# Patient Record
Sex: Male | Born: 1950 | Race: Black or African American | Hispanic: No | Marital: Single | Smoking: Current every day smoker
Health system: Southern US, Community
[De-identification: ages and names within clinical notes are randomized; demographics above are authoritative.]

## PROBLEM LIST (undated history)

## (undated) DIAGNOSIS — I10 Essential (primary) hypertension: Secondary | ICD-10-CM

---

## 2004-09-19 ENCOUNTER — Emergency Department: Payer: Self-pay | Admitting: Emergency Medicine

## 2004-12-07 ENCOUNTER — Ambulatory Visit: Payer: Self-pay | Admitting: Internal Medicine

## 2007-05-25 ENCOUNTER — Emergency Department: Payer: Self-pay | Admitting: Emergency Medicine

## 2007-09-19 ENCOUNTER — Other Ambulatory Visit: Payer: Self-pay

## 2007-09-19 ENCOUNTER — Inpatient Hospital Stay: Payer: Self-pay | Admitting: General Surgery
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
## 2009-04-05 ENCOUNTER — Ambulatory Visit: Payer: Self-pay | Admitting: Family Medicine

## 2013-02-14 ENCOUNTER — Emergency Department: Payer: Self-pay | Admitting: Emergency Medicine

## 2018-01-31 ENCOUNTER — Emergency Department
Admission: EM | Admit: 2018-01-31 | Discharge: 2018-01-31 | Disposition: A | Discharge status: Home / Self Care | Payer: Commercial Managed Care - HMO | Attending: Emergency Medicine | Admitting: Emergency Medicine

## 2018-01-31 DIAGNOSIS — F17210 Nicotine dependence, cigarettes, uncomplicated: Secondary | ICD-10-CM | POA: Insufficient documentation

## 2018-01-31 DIAGNOSIS — F10920 Alcohol use, unspecified with intoxication, uncomplicated: Secondary | ICD-10-CM | POA: Insufficient documentation

## 2018-01-31 HISTORY — DX: Essential (primary) hypertension: I10

## 2018-01-31 NOTE — ED Provider Notes (Signed)
[facility] Emergency Department Provider Note ____________________________________________   First MD Initiated Contact with Patient 01/31/18 2204     (approximate)  I have reviewed the triage vital signs and the nursing notes.   HISTORY  Chief Complaint Medical Clearance  Level 5 caveat: History of present illness limited due to intoxication  HPI Brian Sanders is a 67 y.o. male with history of hypertension and hepatitis C who presents for medical clearance.  Patient [place] in the [place] of [employer] and is intoxicated.  The patient admits to drinking alcohol and using some cocaine.  He denies any acute medical complaints at this time.  The officer states that the patient has not had any trauma to his knowledge.  Past Medical History:  Diagnosis Date  . Hypertension     There are no active problems to display for this patient.   History reviewed. No pertinent surgical history.  Prior to Admission medications   Not on File    Allergies Patient has no known allergies.  No family history on file.  Social History Social History   Tobacco Use  . Smoking status: Current Every Day Smoker  . Smokeless tobacco: Never Used  Substance Use Topics  . Alcohol use: Yes  . Drug use: Not on file    Review of Systems Level 5 caveat: Unable to obtain review of systems due to intoxication    ____________________________________________   PHYSICAL EXAM:  VITAL SIGNS: ED Triage Vitals  Enc Vitals Group     BP 01/31/18 2122 135/75     Pulse Rate 01/31/18 2122 100     Resp 01/31/18 2122 18     Temp 01/31/18 2122 97.8 F (36.6 C)     Temp src --      SpO2 01/31/18 2122 95 %     Weight 01/31/18 2127 128 lb (58.1 kg)     Height 01/31/18 2127 5' 7.5" (1.715 m)     Head Circumference --      Peak Flow --      Pain Score 01/31/18 2127 0     Pain Loc --      Pain Edu? --      Excl. in GC? --     Constitutional: Alert and  oriented.  Comfortable appearing. Eyes: Conjunctivae are normal.  EOMI.  PERRLA. Head: Atraumatic. Nose: No congestion/rhinnorhea. Mouth/Throat: Mucous membranes are moist.   Neck: Normal range of motion.  Cardiovascular:   Good peripheral circulation. Respiratory: Normal respiratory effort.  Gastrointestinal: No distention.  Musculoskeletal: Extremities warm and well perfused.  Neurologic: Slightly slurred speech.  Motor intact in all extremities. Skin:  Skin is warm and dry. No rash noted. Psychiatric: Calm and cooperative.  ____________________________________________   LABS (all labs ordered are listed, but only abnormal results are displayed)  Labs Reviewed - No data to display ____________________________________________  EKG   ____________________________________________  RADIOLOGY    ____________________________________________   PROCEDURES  Procedure(s) performed: No  Procedures  Critical Care performed: No ____________________________________________   INITIAL IMPRESSION / ASSESSMENT AND PLAN / ED COURSE  Pertinent labs & imaging results that were available during my care of the patient were reviewed by me and considered in my medical decision making (see chart for details).  67 year old male with PMH as noted above presents for medical clearance and [employer] custody.  The patient admits to drinking alcohol and using cocaine, and appears intoxicated.  However there is no evidence of trauma, his neuro exam is  nonfocal, and the patient is calm and cooperative.  At this time there is no contraindication for the patient to go to jail or arrangement.  He is medically cleared.  Return precautions given.      ____________________________________________   FINAL CLINICAL IMPRESSION(S) / ED DIAGNOSES  Final diagnoses:  Alcoholic intoxication without complication (HCC)      NEW MEDICATIONS STARTED DURING THIS VISIT:  There are no discharge medications  for this patient.    Note:  This document was prepared using Dragon voice recognition software and may include unintentional dictation errors.    Dionne Bucy, MD 01/31/18 [phone]

## 2018-01-31 NOTE — ED Notes (Addendum)
Pt reported that he had cocaine and alcohol today. Officer is at the bedside. Pt is loud yet calm and cooperative.

## 2018-01-31 NOTE — ED Triage Notes (Signed)
Patient arrives in custody of [employer]. Patient is under arrest, needs medical clearance. Patient is intoxicated per officer.

## 2018-01-31 NOTE — ED Triage Notes (Signed)
Patient admits to drinking 2 40s. Patient has slurred speech. Patient has urinated on himself.

## 2018-01-31 NOTE — Discharge Instructions (Addendum)
Brian Sanders has been evaluated in the emergency department.  At this time, he is medically cleared for jail and/or arrangement.  He may return to the ER for any acute medical concerns.

## 2018-02-27 ENCOUNTER — Encounter: Payer: Self-pay | Admitting: *Deleted

## 2019-02-08 ENCOUNTER — Emergency Department
Admission: EM | Admit: 2019-02-08 | Discharge: 2019-02-09 | Disposition: A | Discharge status: Home / Self Care | Payer: Medicare HMO | Attending: Emergency Medicine | Admitting: Emergency Medicine

## 2019-02-08 ENCOUNTER — Other Ambulatory Visit: Payer: Self-pay

## 2019-02-08 DIAGNOSIS — R7989 Other specified abnormal findings of blood chemistry: Secondary | ICD-10-CM

## 2019-02-08 DIAGNOSIS — E871 Hypo-osmolality and hyponatremia: Secondary | ICD-10-CM | POA: Diagnosis not present

## 2019-02-08 DIAGNOSIS — I10 Essential (primary) hypertension: Secondary | ICD-10-CM | POA: Insufficient documentation

## 2019-02-08 DIAGNOSIS — Y908 Blood alcohol level of 240 mg/100 ml or more: Secondary | ICD-10-CM | POA: Insufficient documentation

## 2019-02-08 DIAGNOSIS — F10929 Alcohol use, unspecified with intoxication, unspecified: Secondary | ICD-10-CM

## 2019-02-08 DIAGNOSIS — Z046 Encounter for general psychiatric examination, requested by authority: Secondary | ICD-10-CM | POA: Insufficient documentation

## 2019-02-08 DIAGNOSIS — F17200 Nicotine dependence, unspecified, uncomplicated: Secondary | ICD-10-CM | POA: Diagnosis not present

## 2019-02-08 DIAGNOSIS — R4182 Altered mental status, unspecified: Secondary | ICD-10-CM | POA: Diagnosis present

## 2019-02-08 DIAGNOSIS — F1410 Cocaine abuse, uncomplicated: Secondary | ICD-10-CM | POA: Diagnosis not present

## 2019-02-08 LAB — COMPREHENSIVE METABOLIC PANEL
ALT: 43 U/L (ref 0–44)
AST: 52 U/L — ABNORMAL HIGH (ref 15–41)
Albumin: 3.9 g/dL (ref 3.5–5.0)
Alkaline Phosphatase: 46 U/L (ref 38–126)
Anion gap: 12 (ref 5–15)
BUN: 22 mg/dL (ref 8–23)
CO2: 21 mmol/L — ABNORMAL LOW (ref 22–32)
Calcium: 9 mg/dL (ref 8.9–10.3)
Chloride: 93 mmol/L — ABNORMAL LOW (ref 98–111)
Creatinine, Ser: 1.87 mg/dL — ABNORMAL HIGH (ref 0.61–1.24)
GFR calc Af Amer: 42 mL/min — ABNORMAL LOW (ref >60)
GFR calc non Af Amer: 36 mL/min — ABNORMAL LOW (ref >60)
Glucose, Bld: 110 mg/dL — ABNORMAL HIGH (ref 70–99)
Potassium: 4.6 mmol/L (ref 3.5–5.1)
Sodium: 126 mmol/L — ABNORMAL LOW (ref 135–145)
Total Bilirubin: 0.6 mg/dL (ref 0.3–1.2)
Total Protein: 7.6 g/dL (ref 6.5–8.1)

## 2019-02-08 LAB — URINE DRUG SCREEN, QUALITATIVE (ARMC ONLY)
Amphetamines, Ur Screen: NOT DETECTED
Barbiturates, Ur Screen: NOT DETECTED
Benzodiazepine, Ur Scrn: NOT DETECTED
Cannabinoid 50 Ng, Ur ~~LOC~~: NOT DETECTED
Cocaine Metabolite,Ur ~~LOC~~: POSITIVE — AB
MDMA (Ecstasy)Ur Screen: NOT DETECTED
Methadone Scn, Ur: NOT DETECTED
Opiate, Ur Screen: NOT DETECTED
Phencyclidine (PCP) Ur S: NOT DETECTED
Tricyclic, Ur Screen: NOT DETECTED

## 2019-02-08 LAB — CBC
HCT: 38.5 % — ABNORMAL LOW (ref 39.0–52.0)
Hemoglobin: 12.9 g/dL — ABNORMAL LOW (ref 13.0–17.0)
MCH: 28.9 pg (ref 26.0–34.0)
MCHC: 33.5 g/dL (ref 30.0–36.0)
MCV: 86.3 fL (ref 80.0–100.0)
Platelets: 241 10*3/uL (ref 150–400)
RBC: 4.46 MIL/uL (ref 4.22–5.81)
RDW: 13.1 % (ref 11.5–15.5)
WBC: 7.3 10*3/uL (ref 4.0–10.5)
nRBC: 0 % (ref 0.0–0.2)

## 2019-02-08 LAB — ACETAMINOPHEN LEVEL: Acetaminophen (Tylenol), Serum: <10 ug/mL — ABNORMAL LOW (ref 10–30)

## 2019-02-08 LAB — SALICYLATE LEVEL: Salicylate Lvl: <7 mg/dL (ref 2.8–30.0)

## 2019-02-08 LAB — ETHANOL: Alcohol, Ethyl (B): 295 mg/dL — ABNORMAL HIGH (ref <10)

## 2019-02-08 MED ORDER — SODIUM CHLORIDE 0.9 % IV BOLUS
1000.0000 mL | Freq: Once | INTRAVENOUS | Status: AC
  Administered 2019-02-08: 1000 mL via INTRAVENOUS

## 2019-02-08 MED ORDER — THIAMINE HCL 100 MG/ML IJ SOLN
100.0000 mg | Freq: Once | INTRAMUSCULAR | Status: AC
  Administered 2019-02-08: 100 mg via INTRAVENOUS
  Filled 2019-02-08: qty 2
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
## 2019-02-08 NOTE — ED Notes (Signed)
Pt provided meal tray and coke to drink
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
## 2019-02-09 DIAGNOSIS — F10929 Alcohol use, unspecified with intoxication, unspecified: Secondary | ICD-10-CM | POA: Insufficient documentation

## 2019-02-09 LAB — BASIC METABOLIC PANEL
Anion gap: 8 (ref 5–15)
BUN: 19 mg/dL (ref 8–23)
CO2: 23 mmol/L (ref 22–32)
Calcium: 8.8 mg/dL — ABNORMAL LOW (ref 8.9–10.3)
Chloride: 103 mmol/L (ref 98–111)
Creatinine, Ser: 1.28 mg/dL — ABNORMAL HIGH (ref 0.61–1.24)
GFR calc Af Amer: >60 mL/min (ref >60)
GFR calc non Af Amer: 57 mL/min — ABNORMAL LOW (ref >60)
Glucose, Bld: 110 mg/dL — ABNORMAL HIGH (ref 70–99)
Potassium: 4.3 mmol/L (ref 3.5–5.1)
Sodium: 134 mmol/L — ABNORMAL LOW (ref 135–145)

## 2019-02-09 NOTE — ED Notes (Signed)
Pt given lunch tray.

## 2019-02-09 NOTE — ED Notes (Signed)
Pt given cab voucher by charge nurse. Unable to contact his sister to provide  a ride home.

## 2019-02-09 NOTE — Consult Note (Signed)
[facility] Face-to-Face Psychiatry Consult   Reason for Consult: psych evaluation due to bizarre behavior and intoxication Referring Physician:  Dr Don Perking Patient Identification: Brian Sanders MRN:  [id number] Principal Diagnosis: <principal problem not specified> Diagnosis:  Alcohol use disorder. Cocaine use disorder.  Total Time spent with patient: 30 minutes  Subjective:   Brian Sanders is a 68 y.o. male patient who was brought to ED last night for bizarre behavior.   HPI:  Reportedly, patient was found laying in the road and was almost stuck by a car.  On arrival to ED, patient was loud, used inappropriate language, screaming "I am high". His BAL was 295 and U-tox positive for cocaine. Other labs remarkable for low Na at 126, elevated creatinin at 1.87, GFR 36. ALT 52, AST 43.  Patient evaluated in AM, while clinically sober.   Patient reports "feeling allright". He states he remembers "somewhat" events from last night. He reports he was drinking and using cocaine. He reports he was walking and fell down on the street. He denies any intentions to kill self. He says "I was drunk". He denies feeling depressed. He denies any thoughts, plans of harming self or others. He denies any hallucinations, does not express delusions. He denies any current physical complaints. He reports he is homeless and was staying on the street for last two weeks after "my sister kicked me out" due to substance use. He denies any past mental health diagnoses; denies taking any psychotropic medications. He reports taking medications for "high blood pressure". He reports feeling safe for discharge. He was offered outpatient chemical-dependency resources which he declined.  PSYCH ROS: Safety: denies suicidal thoughts, denies homicidal thoughts. Depression: denies symptoms.    Anxiety: denies symptoms.  Psychosis: No symptoms;  Mania: no symptoms;  Dementia: no symptoms.  Delirium: no symptoms (such as waxing  and waning confusion in a day; altered sensorium; impaired attention; speech problem).   PAST PSYCH HISTORY:  Previous psych diagnoses: denies Previous psychiatric hospitalizations: denies Previous outpatient psychiatrist: denies Therapist/Counselor: denies History of prior suicide attempts: denies Non-suicidal self-injurious behaviors: denies History of violence: denies Previous psych medication: denies Current psych medications: denies   SOCIAL HISTORY: -Patient has no guardian. -Homeless -single -Children: no -Work/finances: unemployed -Armed forces operational officer history: denies -Guns in possession: denies    SUBSTANCE USE: alcohol, cocaine use - occasionally.  PSYCH RISK ASSESSMENT: Suicide Risk Assessment: male gender, age, alcohol/substance use, homelessness - risk factors. The patient denies suicidal thoughts, denies a history of suicidal attempts, denies access to firearm and appears to be is future-oriented. Therefore, represents a low risk for harming self acutely and elevated chronic risk due to non-modifiable risk factors.  Violence Risk Assessment: male gender, substance use - are static risk factors predisposing to violence - place him at low to moderate chronic risk of harming others; though he is an acute low risk to others as he is not having any thoughts of wanting to hurt others and recognizes the consequences if he were be physically violent.  Risk to Self:  No Risk to Others:   Prior Inpatient Therapy:  no Prior Outpatient Therapy:  no  Past Medical History:  Past Medical History:  Diagnosis Date  . Hypertension    No past surgical history on file. Family History: No family history on file. Family Psychiatric  History: denies h/o suicides Social History: see above Social History   Substance and Sexual Activity  Alcohol Use Yes     Social History   Substance and  Sexual Activity  Drug Use Not on file    Social History   Socioeconomic History  . Marital status:  Single    Spouse name: Not on file  . Number of children: Not on file  . Years of education: Not on file  . Highest education level: Not on file  Occupational History  . Not on file  Social Needs  . Financial resource strain: Not on file  . Food insecurity:    Worry: Not on file    Inability: Not on file  . Transportation needs:    Medical: Not on file    Non-medical: Not on file  Tobacco Use  . Smoking status: Current Every Day Smoker  . Smokeless tobacco: Never Used  Substance and Sexual Activity  . Alcohol use: Yes  . Drug use: Not on file  . Sexual activity: Not on file  Lifestyle  . Physical activity:    Days per week: Not on file    Minutes per session: Not on file  . Stress: Not on file  Relationships  . Social connections:    Talks on phone: Not on file    Gets together: Not on file    Attends religious service: Not on file    Active member of club or organization: Not on file    Attends meetings of clubs or organizations: Not on file    Relationship status: Not on file  Other Topics Concern  . Not on file  Social History Narrative  . Not on file   Additional Social History: see above    Allergies:  No Known Allergies  Labs:  Results for orders placed or performed during the hospital encounter of 02/08/19 (from the past 48 hour(s))  Comprehensive metabolic panel     Status: Abnormal   Collection Time: 02/08/19  9:40 PM  Result Value Ref Range   Sodium 126 (L) 135 - 145 mmol/L   Potassium 4.6 3.5 - 5.1 mmol/L   Chloride 93 (L) 98 - 111 mmol/L   CO2 21 (L) 22 - 32 mmol/L   Glucose, Bld 110 (H) 70 - 99 mg/dL   BUN 22 8 - 23 mg/dL   Creatinine, Ser 0.17 (H) 0.61 - 1.24 mg/dL   Calcium 9.0 8.9 - 49.4 mg/dL   Total Protein 7.6 6.5 - 8.1 g/dL   Albumin 3.9 3.5 - 5.0 g/dL   AST 52 (H) 15 - 41 U/L   ALT 43 0 - 44 U/L   Alkaline Phosphatase 46 38 - 126 U/L   Total Bilirubin 0.6 0.3 - 1.2 mg/dL   GFR calc non Af Amer 36 (L) >60 mL/min   GFR calc Af Amer  42 (L) >60 mL/min   Anion gap 12 5 - 15    Comment: Performed at [facility], [address] [place], [place] [address]  Ethanol     Status: Abnormal   Collection Time: 02/08/19  9:40 PM  Result Value Ref Range   Alcohol, Ethyl (B) 295 (H) <10 mg/dL    Comment: (NOTE) Lowest detectable limit for serum alcohol is 10 mg/dL. For medical purposes only. Performed at [facility], [address] [place], [place] [address]   Salicylate level     Status: None   Collection Time: 02/08/19  9:40 PM  Result Value Ref Range   Salicylate Lvl <7.0 2.8 - 30.0 mg/dL    Comment: Performed at [facility], [address] [place], [place] [address]  Acetaminophen  level     Status: Abnormal   Collection Time: 02/08/19  9:40 PM  Result Value Ref Range   Acetaminophen (Tylenol), Serum <10 (L) 10 - 30 ug/mL    Comment: (NOTE) Therapeutic concentrations vary significantly. A range of 10-30 ug/mL  may be an effective concentration for many patients. However, some  are best treated at concentrations outside of this range. Acetaminophen concentrations >150 ug/mL at 4 hours after ingestion  and >50 ug/mL at 12 hours after ingestion are often associated with  toxic reactions. Performed at [facility], [address] [place], [place] [address]   cbc     Status: Abnormal   Collection Time: 02/08/19  9:40 PM  Result Value Ref Range   WBC 7.3 4.0 - 10.5 K/uL   RBC 4.46 4.22 - 5.81 MIL/uL   Hemoglobin 12.9 (L) 13.0 - 17.0 g/dL   HCT 60.4 (L) 54.0 - 98.1 %   MCV 86.3 80.0 - 100.0 fL   MCH 28.9 26.0 - 34.0 pg   MCHC 33.5 30.0 - 36.0 g/dL   RDW 19.1 47.8 - 29.5 %   Platelets 241 150 - 400 K/uL   nRBC 0.0 0.0 - 0.2 %    Comment: Performed at [facility], [address], [place], [place] [address]  Urine Drug Screen, Qualitative     Status: Abnormal   Collection Time: 02/08/19  9:40 PM  Result Value Ref Range   Tricyclic, Ur Screen NONE DETECTED NONE  DETECTED   Amphetamines, Ur Screen NONE DETECTED NONE DETECTED   MDMA (Ecstasy)Ur Screen NONE DETECTED NONE DETECTED   Cocaine Metabolite,Ur [place] POSITIVE (A) NONE DETECTED   Opiate, Ur Screen NONE DETECTED NONE DETECTED   Phencyclidine (PCP) Ur S NONE DETECTED NONE DETECTED   Cannabinoid 50 Ng, Ur [place] NONE DETECTED NONE DETECTED   Barbiturates, Ur Screen NONE DETECTED NONE DETECTED   Benzodiazepine, Ur Scrn NONE DETECTED NONE DETECTED   Methadone Scn, Ur NONE DETECTED NONE DETECTED    Comment: (NOTE) Tricyclics + metabolites, urine    Cutoff 1000 ng/mL Amphetamines + metabolites, urine  Cutoff 1000 ng/mL MDMA (Ecstasy), urine              Cutoff 500 ng/mL Cocaine Metabolite, urine          Cutoff 300 ng/mL Opiate + metabolites, urine        Cutoff 300 ng/mL Phencyclidine (PCP), urine         Cutoff 25 ng/mL Cannabinoid, urine                 Cutoff 50 ng/mL Barbiturates + metabolites, urine  Cutoff 200 ng/mL Benzodiazepine, urine              Cutoff 200 ng/mL Methadone, urine                   Cutoff 300 ng/mL The urine drug screen provides only a preliminary, unconfirmed analytical test result and should not be used for non-medical purposes. Clinical consideration and professional judgment should be applied to any positive drug screen result due to possible interfering substances. A more specific alternate chemical method must be used in order to obtain a confirmed analytical result. Gas chromatography / mass spectrometry (GC/MS) is the preferred confirmat ory method. Performed at [facility], [address], [place], [place] [address]   Basic metabolic panel     Status: Abnormal   Collection Time: 02/09/19  7:56 AM  Result Value Ref Range  Sodium 134 (L) 135 - 145 mmol/L   Potassium 4.3 3.5 - 5.1 mmol/L   Chloride 103 98 - 111 mmol/L   CO2 23 22 - 32 mmol/L   Glucose, Bld 110 (H) 70 - 99 mg/dL   BUN 19 8 - 23 mg/dL   Creatinine, Ser 1.611.28 (H) 0.61 - 1.24 mg/dL    Calcium 8.8 (L) 8.9 - 10.3 mg/dL   GFR calc non Af Amer 57 (L) >60 mL/min   GFR calc Af Amer >60 >60 mL/min   Anion gap 8 5 - 15    Comment: Performed at [facility], [address] [place], [place] [address]    No current facility-administered medications for this encounter.    No current outpatient medications on file.    Psychiatric Specialty Exam: Physical Exam  ROS  Blood pressure 131/81, pulse 79, temperature 97.9 F (36.6 C), temperature source Oral, resp. rate 18, height 5\' 7"  (1.702 m), weight 68 kg, SpO2 98 %.Body mass index is 23.49 kg/m.  General Appearance: Disheveled  Eye Contact:  Fair  Speech:  Normal Rate  Volume:  Normal  Mood:  Euthymic  Affect:  Constricted  Thought Process:  Coherent and Linear  Orientation:  Place and person  Thought Content:  Logical   Thought perception: patient denies auditory and visual hallucinations, no illusions, no depersonalizations. Did not appear internally stimulated.    Suicidal Thoughts:  No  Homicidal Thoughts:  No  Memory:  Immediate;   Fair Recent;   Fair Remote;   Fair  Judgement:  Fair  Insight:  Fair  Psychomotor Activity:  Normal  Concentration:  Concentration: Fair and Attention Span: Fair  Recall:    Fund of Knowledge:  Fair  Language:  Fair  Akathisia:  No  Handed:    AIMS (if indicated):     Assets:  Desire for Improvement  ADL's:  Intact  Cognition:  WNL  Sleep:       Disposition: No evidence of imminent risk to self or others at present.   Patient does not meet criteria for psychiatric inpatient admission.  ASSESSMENT: Brian Sanders is a 68 y.o. male with a history of alcohol use disorder, cocaine use disorder, who was brought to ER by EMS due to bizarre behavior. Psychiatry called for consult.  On arrival patient appeared intoxicated, BAL was 295 and U-tox positive for cocaine. Patient slept overnight, evaluated in AM when sober. During my assessment, pt is calm, cooperative,  pleasant and logical, has no intentions of harming self or anybody, he does not appear to be acutely psychotic or at risk of harm to self or others. His substance use seems to be his chronic condition. Patient does not meet criteria for an inpatient psych admission.  Impression: Alcohol use disorder. Cocaine use disorder. Acute alcohol intoxication.  Recommendations: -No indication for inpatient psychiatric hospitalization -continue Thiamine, folic acid, multivitamins. -Patient declined outpatient chemical-dependency resources at this time. -Should the patient be unable to maintain his personal safety or the safety of others, instructions were provided to dial 9-1-1 or go to the closest emergency room.   Thalia PartyAlisa Jasson Siegmann, MD 02/09/2019 10:07 AM

## 2019-02-09 NOTE — ED Notes (Signed)
Pt discharged home with cab voucher. VS stable. Denies pain. Denies SI/HI. Discharge instructions reviewed with patient. Patient signed for discharge. All belongings returned to patient. Confirmed by patient.

## 2019-02-09 NOTE — Consult Note (Deleted)
[facility] Face-to-Face Psychiatry Consult   Reason for Consult:  Psych eval due to self-injury Referring Physician:  Dr. Don Perking Patient Identification: Brian Sanders MRN:  [id number] Principal Diagnosis: <principal problem not specified>   Diagnosis: Alcohol use disorder.  Total Time spent with patient: 45 minutes  Subjective:   Brian Sanders is a 68 y.o. AAM with a history of substance use.     Past Medical History:  Past Medical History:  Diagnosis Date  . Hypertension    No past surgical history on file. Family History: No family history on file. Family Psychiatric  History: see above Social History: see above Social History   Substance and Sexual Activity  Alcohol Use Yes     Social History   Substance and Sexual Activity  Drug Use Not on file    Social History   Socioeconomic History  . Marital status: Single    Spouse name: Not on file  . Number of children: Not on file  . Years of education: Not on file  . Highest education level: Not on file  Occupational History  . Not on file  Social Needs  . Financial resource strain: Not on file  . Food insecurity:    Worry: Not on file    Inability: Not on file  . Transportation needs:    Medical: Not on file    Non-medical: Not on file  Tobacco Use  . Smoking status: Current Every Day Smoker  . Smokeless tobacco: Never Used  Substance and Sexual Activity  . Alcohol use: Yes  . Drug use: Not on file  . Sexual activity: Not on file  Lifestyle  . Physical activity:    Days per week: Not on file    Minutes per session: Not on file  . Stress: Not on file  Relationships  . Social connections:    Talks on phone: Not on file    Gets together: Not on file    Attends religious service: Not on file    Active member of club or organization: Not on file    Attends meetings of clubs or organizations: Not on file    Relationship status: Not on file  Other Topics Concern  . Not on file  Social History  Narrative  . Not on file   Additional Social History:    Allergies:  No Known Allergies  Labs:  Results for orders placed or performed during the hospital encounter of 02/08/19 (from the past 48 hour(s))  Comprehensive metabolic panel     Status: Abnormal   Collection Time: 02/08/19  9:40 PM  Result Value Ref Range   Sodium 126 (L) 135 - 145 mmol/L   Potassium 4.6 3.5 - 5.1 mmol/L   Chloride 93 (L) 98 - 111 mmol/L   CO2 21 (L) 22 - 32 mmol/L   Glucose, Bld 110 (H) 70 - 99 mg/dL   BUN 22 8 - 23 mg/dL   Creatinine, Ser 1.02 (H) 0.61 - 1.24 mg/dL   Calcium 9.0 8.9 - 72.5 mg/dL   Total Protein 7.6 6.5 - 8.1 g/dL   Albumin 3.9 3.5 - 5.0 g/dL   AST 52 (H) 15 - 41 U/L   ALT 43 0 - 44 U/L   Alkaline Phosphatase 46 38 - 126 U/L   Total Bilirubin 0.6 0.3 - 1.2 mg/dL   GFR calc non Af Amer 36 (L) >60 mL/min   GFR calc Af Amer 42 (L) >60 mL/min   Anion gap 12 5 -  15    Comment: Performed at [facility], [address] [place], [place] [address]  Ethanol     Status: Abnormal   Collection Time: 02/08/19  9:40 PM  Result Value Ref Range   Alcohol, Ethyl (B) 295 (H) <10 mg/dL    Comment: (NOTE) Lowest detectable limit for serum alcohol is 10 mg/dL. For medical purposes only. Performed at [facility], [address] [place], [place] [address]   Salicylate level     Status: None   Collection Time: 02/08/19  9:40 PM  Result Value Ref Range   Salicylate Lvl <7.0 2.8 - 30.0 mg/dL    Comment: Performed at [facility], [address] [place], [place] [address]  Acetaminophen level     Status: Abnormal   Collection Time: 02/08/19  9:40 PM  Result Value Ref Range   Acetaminophen (Tylenol), Serum <10 (L) 10 - 30 ug/mL    Comment: (NOTE) Therapeutic concentrations vary significantly. A range of 10-30 ug/mL  may be an effective concentration for many patients. However, some  are best treated at concentrations outside of this range. Acetaminophen  concentrations >150 ug/mL at 4 hours after ingestion  and >50 ug/mL at 12 hours after ingestion are often associated with  toxic reactions. Performed at [facility], [address] [place], [place] [address]   cbc     Status: Abnormal   Collection Time: 02/08/19  9:40 PM  Result Value Ref Range   WBC 7.3 4.0 - 10.5 K/uL   RBC 4.46 4.22 - 5.81 MIL/uL   Hemoglobin 12.9 (L) 13.0 - 17.0 g/dL   HCT 65.738.5 (L) 84.639.0 - 96.252.0 %   MCV 86.3 80.0 - 100.0 fL   MCH 28.9 26.0 - 34.0 pg   MCHC 33.5 30.0 - 36.0 g/dL   RDW 95.213.1 84.111.5 - 32.415.5 %   Platelets 241 150 - 400 K/uL   nRBC 0.0 0.0 - 0.2 %    Comment: Performed at [facility], [address] [place], [place] [address]  Urine Drug Screen, Qualitative     Status: Abnormal   Collection Time: 02/08/19  9:40 PM  Result Value Ref Range   Tricyclic, Ur Screen NONE DETECTED NONE DETECTED   Amphetamines, Ur Screen NONE DETECTED NONE DETECTED   MDMA (Ecstasy)Ur Screen NONE DETECTED NONE DETECTED   Cocaine Metabolite,Ur [place] POSITIVE (A) NONE DETECTED   Opiate, Ur Screen NONE DETECTED NONE DETECTED   Phencyclidine (PCP) Ur S NONE DETECTED NONE DETECTED   Cannabinoid 50 Ng, Ur [place] NONE DETECTED NONE DETECTED   Barbiturates, Ur Screen NONE DETECTED NONE DETECTED   Benzodiazepine, Ur Scrn NONE DETECTED NONE DETECTED   Methadone Scn, Ur NONE DETECTED NONE DETECTED    Comment: (NOTE) Tricyclics + metabolites, urine    Cutoff 1000 ng/mL Amphetamines + metabolites, urine  Cutoff 1000 ng/mL MDMA (Ecstasy), urine              Cutoff 500 ng/mL Cocaine Metabolite, urine          Cutoff 300 ng/mL Opiate + metabolites, urine        Cutoff 300 ng/mL Phencyclidine (PCP), urine         Cutoff 25 ng/mL Cannabinoid, urine                 Cutoff 50 ng/mL Barbiturates + metabolites, urine  Cutoff 200 ng/mL Benzodiazepine, urine              Cutoff 200 ng/mL  Methadone, urine                   Cutoff 300 ng/mL The urine drug screen provides only a  preliminary, unconfirmed analytical test result and should not be used for non-medical purposes. Clinical consideration and professional judgment should be applied to any positive drug screen result due to possible interfering substances. A more specific alternate chemical method must be used in order to obtain a confirmed analytical result. Gas chromatography / mass spectrometry (GC/MS) is the preferred confirmat ory method. Performed at [facility], [address] [place], [place] [address]     No current facility-administered medications for this encounter.    No current outpatient medications on file.    Psychiatric Specialty Exam: Physical Exam  ROS  Blood pressure 111/77, pulse (!) 109, temperature 98.5 F (36.9 C), temperature source Oral, resp. rate 18, height 5\' 7"  (1.702 m), weight 68 kg, SpO2 100 %.Body mass index is 23.49 kg/m.  General Appearance: discheveled  Eye Contact:  Good  Speech:  Normal Rate  Volume:  Normal  Mood:  Euthymic  Affect:  Appropriate and Congruent  Thought Process:  Coherent, Goal Directed and Linear  Orientation:  Place and person  Thought Content:  Logical  Suicidal Thoughts:  No  Homicidal Thoughts:  No  Memory:  Immediate;   Fair Recent;   Fair Remote;   Fair  Judgement:  Good  Insight:  Fair  Psychomotor Activity:  Normal  Concentration:  Concentration: Fair and Attention Span: Fair  Recall:  [employer] of Knowledge:  Fair  Language:  Good  Akathisia:  No  Handed:    AIMS (if indicated):     Assets:  Desire for Improvement Housing  ADL's:  Intact  Cognition:  WNL  Sleep:        Disposition: No evidence of imminent risk to self or others at present.   Patient does not meet criteria for psychiatric inpatient admission.  ASSESSMENT: Brian Sanders is a 68 y.o. male with a history of alcohol use disorder, cocaine use disorder, who was brought to ER by EMS due to bizarre behavior. Psychiatry called for  consult.  On arrival patient appeared intoxicated, BAL was 295 and U-tox positive for cocaine. Patient slept overnight, evaluated in AM when sober. During my assessment, pt is calm, cooperative, pleasant and logical, has no intentions of harming self or anybody, he does not appear to be acutely psychotic or at risk of harm to self or others. His substance use seems to be his chronic condition. Patient does not meet criteria for an inpatient psych admission.  Impression: Alcohol use disorder. Cocaine use disorder. Acute alcohol intoxication.  Recommendations: -No indication for inpatient psychiatric hospitalization -continue Thiamine, folic acid, multivitamins. -SW to provide to patient a list of outpatient resources.   Thalia Party, MD 02/09/2019 9:15 AM

## 2019-02-09 NOTE — ED Notes (Signed)
Pt given breakfast tray

## 2019-02-09 NOTE — ED Provider Notes (Signed)
-----------------------------------------   6:28 AM on 02/09/2019 -----------------------------------------   Blood pressure 111/77, pulse (!) 109, temperature 98.5 F (36.9 C), temperature source Oral, resp. rate 18, height 5\' 7"  (1.702 m), weight 68 kg, SpO2 100 %.  The patient is sleeping at this time.  There have been no acute events since the last update.  Awaiting disposition plan from Behavioral Medicine team.   Irean Hong, MD 02/09/19 [phone]

## 2019-02-09 NOTE — ED Notes (Signed)
Pt dressing for discharge. Belongings searched.

## 2019-02-09 NOTE — ED Provider Notes (Signed)
-----------------------------------------   9:51 AM on 02/09/2019 -----------------------------------------  Sodium is 134 and repeat creatinine is improving we do not know his baseline but this is certainly better than when he came in.  Is no longer intoxicated he is eating and drinking he is requesting discharge he was seen evaluated by psychiatry contract for safety has no SI or HI he is disinclined to pursue alcohol counseling at this time he does not want I think the most he would like to be discharged.  Psychiatry did clear him for discharge, and however versus involuntary commitment.  We will refer him as an outpatient to primary care for his renal insufficiency as well [facility] for his EtOH abuse.  Return precautions follow-up given and understood.   Jeanmarie Plant, MD 02/09/19 [phone]

## 2020-08-03 ENCOUNTER — Other Ambulatory Visit: Payer: Self-pay | Admitting: Physician Assistant

## 2020-08-03 DIAGNOSIS — M79604 Pain in right leg: Secondary | ICD-10-CM

## 2020-08-03 DIAGNOSIS — M79605 Pain in left leg: Secondary | ICD-10-CM

## 2020-08-04 ENCOUNTER — Other Ambulatory Visit: Payer: Self-pay | Admitting: Physician Assistant

## 2020-08-04 DIAGNOSIS — I739 Peripheral vascular disease, unspecified: Secondary | ICD-10-CM

## 2020-08-05 ENCOUNTER — Other Ambulatory Visit: Payer: Self-pay

## 2020-08-05 ENCOUNTER — Ambulatory Visit
Admission: RE | Admit: 2020-08-05 | Discharge: 2020-08-05 | Disposition: A | Discharge status: Home / Self Care | Payer: Medicare Other | Source: Ambulatory Visit | Attending: Physician Assistant | Admitting: Physician Assistant

## 2020-08-05 DIAGNOSIS — M79604 Pain in right leg: Secondary | ICD-10-CM | POA: Diagnosis not present

## 2020-08-05 DIAGNOSIS — M79605 Pain in left leg: Secondary | ICD-10-CM | POA: Diagnosis present

## 2020-08-06 ENCOUNTER — Ambulatory Visit
Admission: RE | Admit: 2020-08-06 | Discharge: 2020-08-06 | Disposition: A | Discharge status: Home / Self Care | Payer: Medicare Other | Source: Ambulatory Visit | Attending: Physician Assistant | Admitting: Physician Assistant

## 2020-08-06 DIAGNOSIS — I739 Peripheral vascular disease, unspecified: Secondary | ICD-10-CM | POA: Insufficient documentation

## 2020-11-09 DEATH — deceased

## 2021-11-18 IMAGING — US US EXTREM LOW VENOUS
1 series · 13 of 24 positions shown · non-contrast
Comparison: None.

CLINICAL DATA: 6-year-old male with history of bilateral leg pain
for 2-3 weeks.



[Series 1: us venous img lower bilat (dvt) · portal-venous · 13 of 61 slices shown]
[im 1/61]
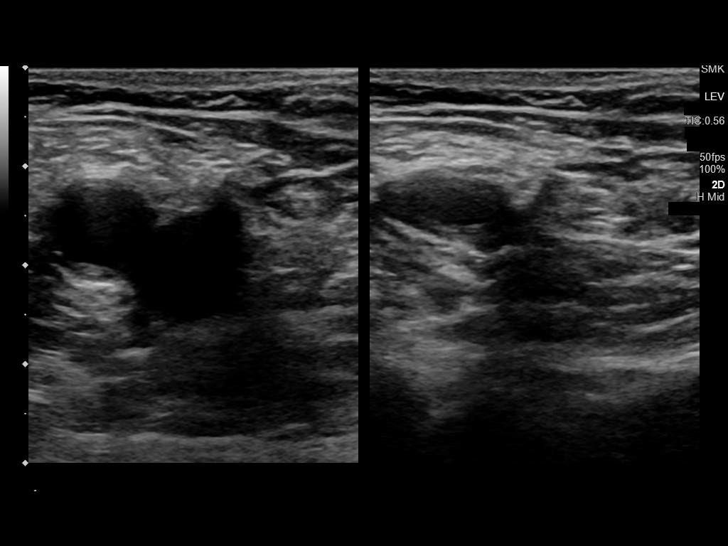
[im 6/61]
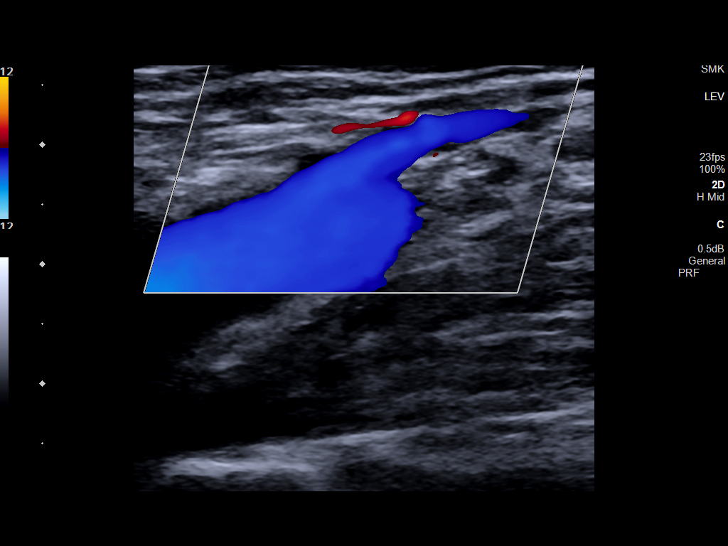
[im 11/61]
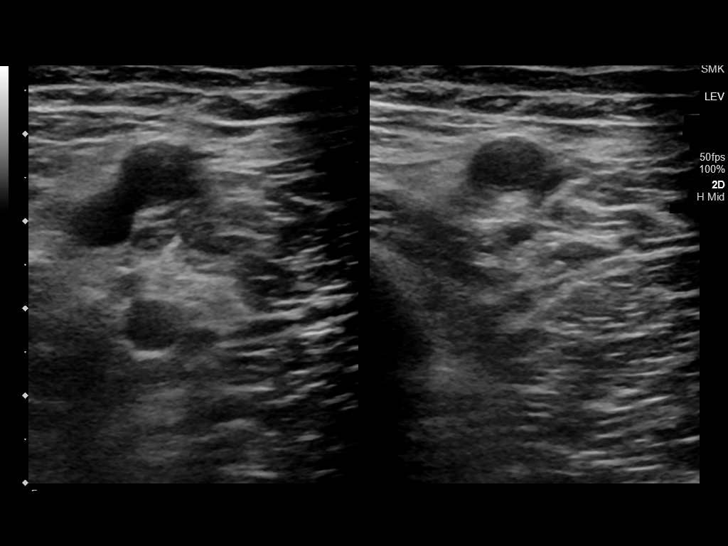
[im 16/61]
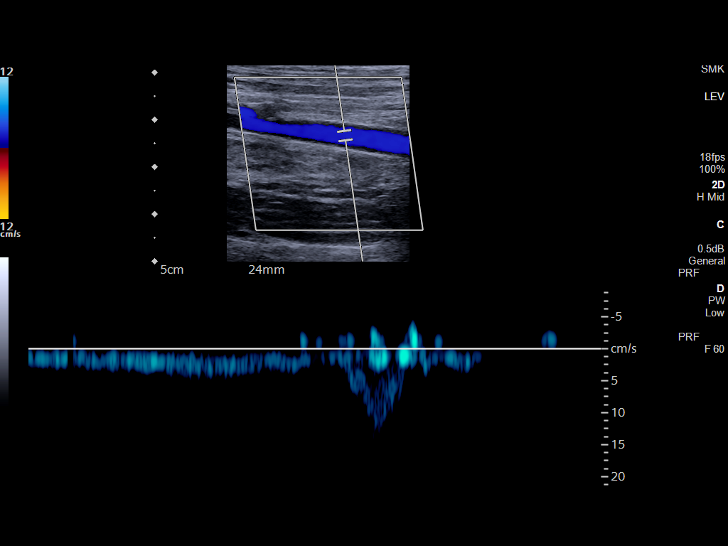
[im 21/61]
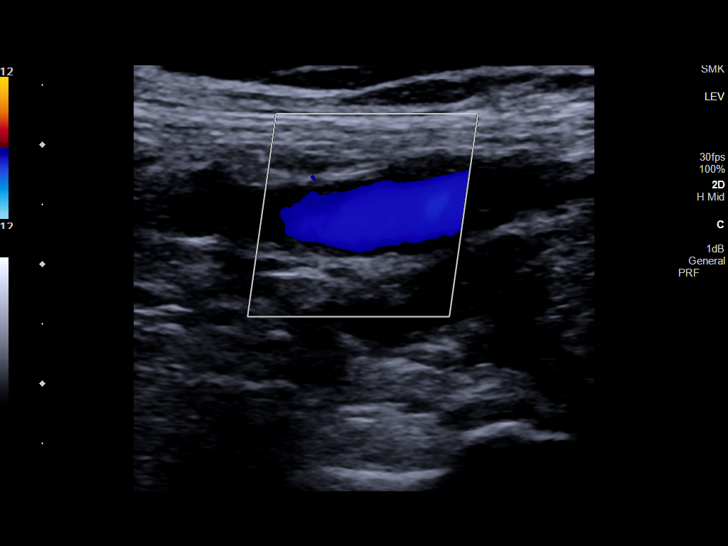
[im 27/61]
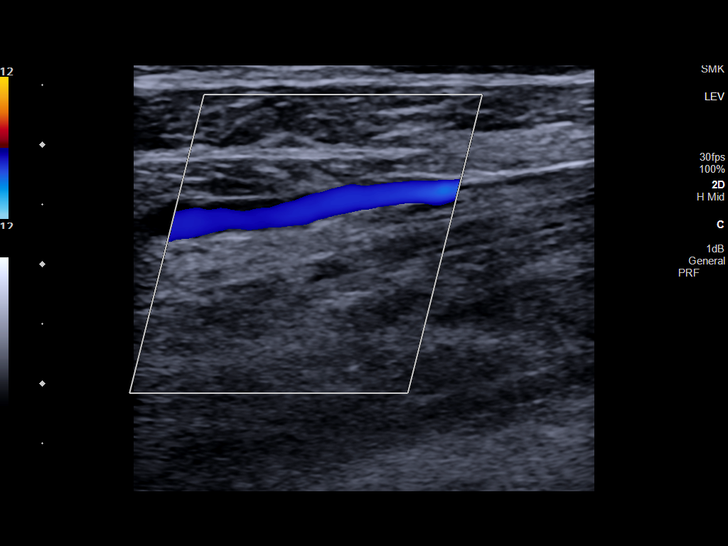
[im 32/61]
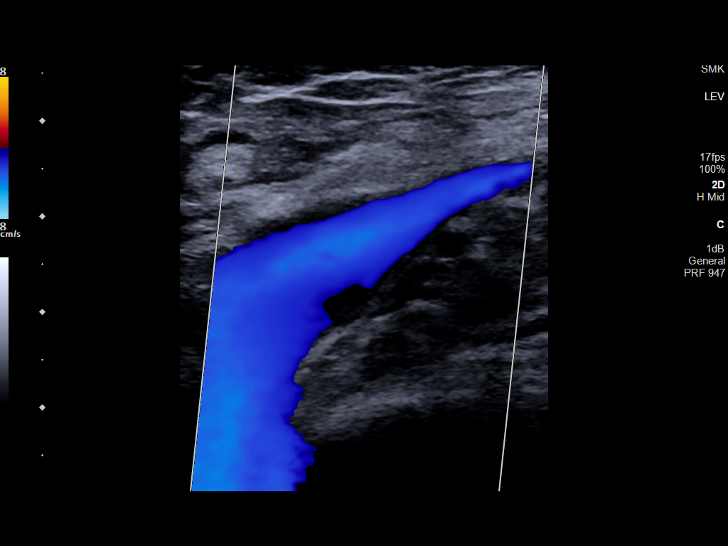
[im 34/61]
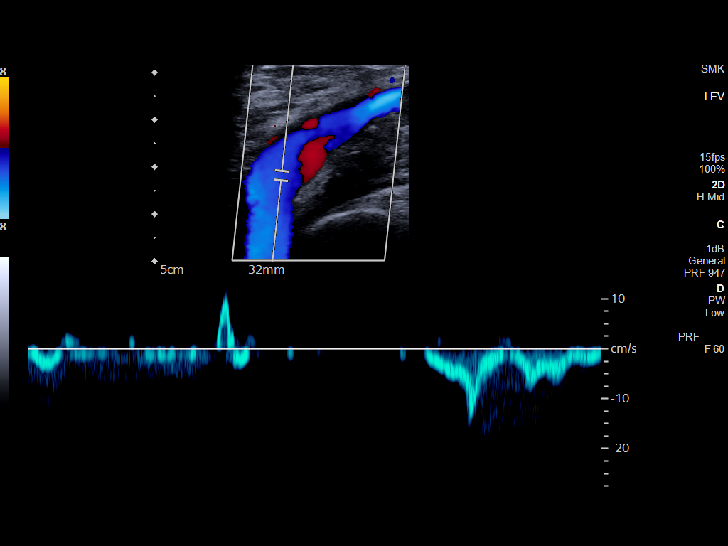
[im 40/61]
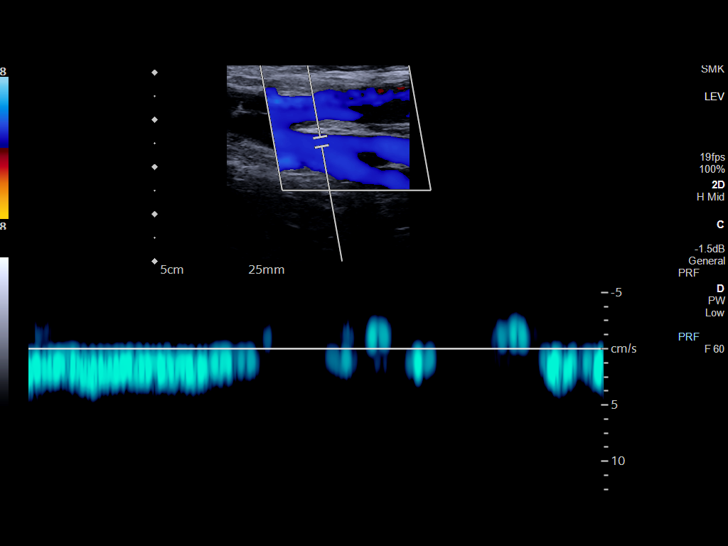
[im 45/61]
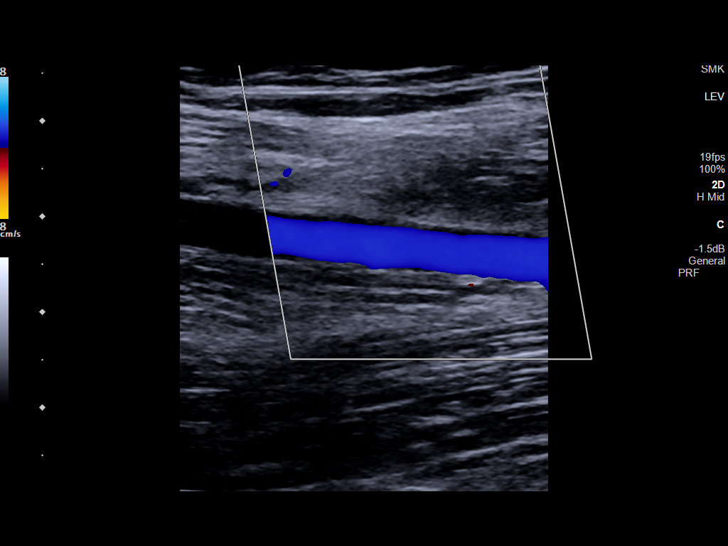
[im 50/61]
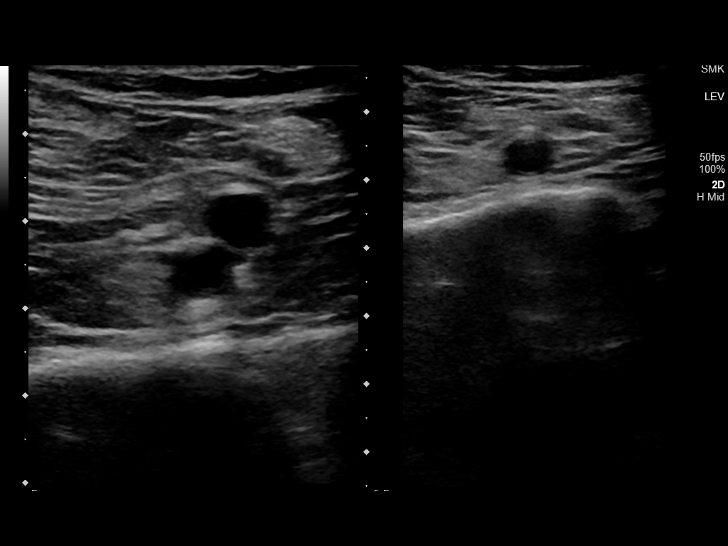
[im 55/61]
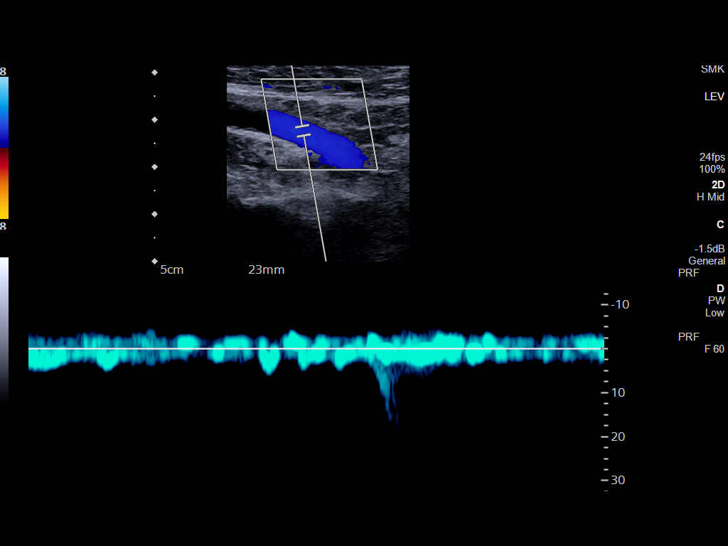
[im 61/61]
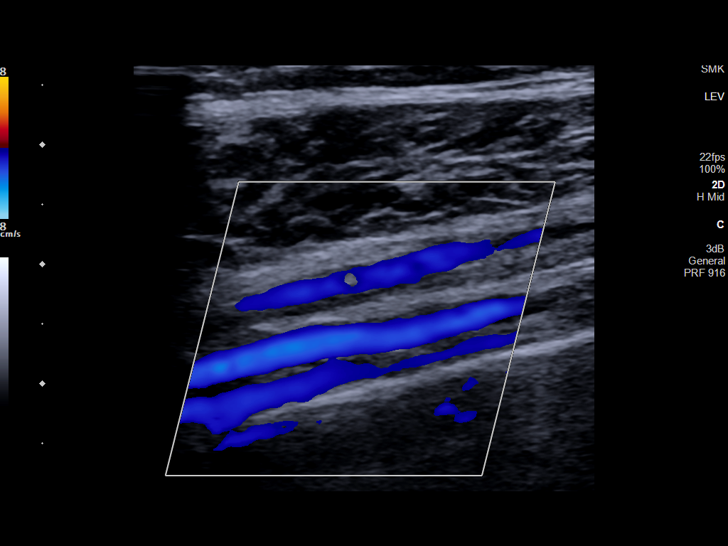

[13 of 24 positions shown; findings below may reference images not displayed]

FINDINGS: RIGHT LOWER EXTREMITY

Common Femoral Vein: No evidence of thrombus. Normal
compressibility, respiratory phasicity and response to augmentation.

Saphenofemoral Junction: No evidence of thrombus. Normal
compressibility and flow on color Doppler imaging.

Profunda Femoral Vein: No evidence of thrombus. Normal
compressibility and flow on color Doppler imaging.

Femoral Vein: No evidence of thrombus. Normal compressibility,
respiratory phasicity and response to augmentation.

Popliteal Vein: No evidence of thrombus. Normal compressibility,
respiratory phasicity and response to augmentation.

Calf Veins: No evidence of thrombus. Normal compressibility and flow
on color Doppler imaging.

Superficial Great Saphenous Vein: No evidence of thrombus. Normal
compressibility.

Other Findings:  None.

LEFT LOWER EXTREMITY

Common Femoral Vein: No evidence of thrombus. Normal
compressibility, respiratory phasicity and response to augmentation.

Saphenofemoral Junction: No evidence of thrombus. Normal
compressibility and flow on color Doppler imaging.

Profunda Femoral Vein: No evidence of thrombus. Normal
compressibility and flow on color Doppler imaging.

Femoral Vein: No evidence of thrombus. Normal compressibility,
respiratory phasicity and response to augmentation.

Popliteal Vein: No evidence of thrombus. Normal compressibility,
respiratory phasicity and response to augmentation.

Calf Veins: No evidence of thrombus. Normal compressibility and flow
on color Doppler imaging.

Superficial Great Saphenous Vein: No evidence of thrombus. Normal
compressibility.

Other Findings:  None.
IMPRESSION: No evidence of bilateral lower extremity deep venous thrombosis.
